# Patient Record
Sex: Female | Born: 1980 | Race: White | Hispanic: No | Marital: Single | Smoking: Former smoker
Health system: Southern US, Community
[De-identification: ages and names within clinical notes are randomized; demographics above are authoritative.]

## PROBLEM LIST (undated history)

## (undated) DIAGNOSIS — K589 Irritable bowel syndrome without diarrhea: Secondary | ICD-10-CM

## (undated) DIAGNOSIS — K6289 Other specified diseases of anus and rectum: Secondary | ICD-10-CM

## (undated) DIAGNOSIS — K649 Unspecified hemorrhoids: Secondary | ICD-10-CM

## (undated) DIAGNOSIS — E785 Hyperlipidemia, unspecified: Secondary | ICD-10-CM

## (undated) DIAGNOSIS — B029 Zoster without complications: Secondary | ICD-10-CM

## (undated) DIAGNOSIS — Z973 Presence of spectacles and contact lenses: Secondary | ICD-10-CM

## (undated) DIAGNOSIS — K602 Anal fissure, unspecified: Secondary | ICD-10-CM

## (undated) DIAGNOSIS — J45909 Unspecified asthma, uncomplicated: Secondary | ICD-10-CM

## (undated) DIAGNOSIS — J189 Pneumonia, unspecified organism: Secondary | ICD-10-CM

## (undated) HISTORY — DX: Irritable bowel syndrome without diarrhea: K58.9

## (undated) HISTORY — DX: Zoster without complications: B02.9

## (undated) HISTORY — DX: Pneumonia, unspecified organism: J18.9

## (undated) HISTORY — PX: COLONOSCOPY: SHX174

## (undated) HISTORY — DX: Anal fissure, unspecified: K60.2

## (undated) HISTORY — PX: WISDOM TOOTH EXTRACTION: SHX21

## (undated) HISTORY — DX: Unspecified asthma, uncomplicated: J45.909

## (undated) HISTORY — DX: Unspecified hemorrhoids: K64.9

## (undated) HISTORY — DX: Hyperlipidemia, unspecified: E78.5

---

## 2007-03-03 ENCOUNTER — Ambulatory Visit: Payer: Self-pay | Admitting: Family Medicine

## 2007-03-05 HISTORY — PX: COLONOSCOPY: SHX174

## 2007-05-25 ENCOUNTER — Ambulatory Visit: Payer: Self-pay | Admitting: Gastroenterology

## 2007-05-31 ENCOUNTER — Emergency Department: Payer: Self-pay | Admitting: Emergency Medicine

## 2008-02-09 ENCOUNTER — Ambulatory Visit: Payer: Self-pay | Admitting: Surgery

## 2008-02-12 ENCOUNTER — Ambulatory Visit: Payer: Self-pay | Admitting: Surgery

## 2011-01-18 ENCOUNTER — Encounter: Payer: Self-pay | Admitting: Internal Medicine

## 2011-01-18 ENCOUNTER — Ambulatory Visit (INDEPENDENT_AMBULATORY_CARE_PROVIDER_SITE_OTHER): Payer: PRIVATE HEALTH INSURANCE | Admitting: Internal Medicine

## 2011-01-18 VITALS — BP 104/60 | HR 77 | Temp 98.2°F | Resp 16 | Ht 65.5 in | Wt 132.2 lb

## 2011-01-18 DIAGNOSIS — K589 Irritable bowel syndrome without diarrhea: Secondary | ICD-10-CM

## 2011-01-18 DIAGNOSIS — E785 Hyperlipidemia, unspecified: Secondary | ICD-10-CM

## 2011-01-18 MED ORDER — DICYCLOMINE HCL 20 MG PO TABS: 20.0000 mg | ORAL_TABLET | Freq: Three times a day (TID) | ORAL | Status: DC | PRN

## 2011-01-18 NOTE — Patient Instructions (Addendum)
You should repeat a fasting lipid panel to recheck triglycerides.  No food  But ok to drink water,  8 hr food fast,   Please ask them to check a b12 /rbc folate and a cbc with your blood draw.    Try the dicylcomine 30 minutes prior to meals to see if it helps the bowel movements  If no results in a few weeks, call and we'll set up the ultrasound.

## 2011-01-18 NOTE — Progress Notes (Signed)
  Subjective:    Patient ID: Robin Macdonald, female    DOB: March 05, 1980, 30 y.o.   MRN: [id number]  HPI 30 yr old female of [place] descent who presents with a 3 yr history abdominal distension and bloating.  Symptoms are aggravated by heavy menses.  She also has a history of hemorrhoids despite a well balanced vegetarian diet that includes [place] yogurt and plenty of legu,es.  She had a prior colonoscopy that was notable for a few noncancerous polyps that were removed, hemorrhoids which  were banded with good resolution. She has no weight loss, acutally has had unexpected weight gain but has not been running for the past year and a half and has gained 15 lbs.  Moderate alcohol use.  Last pelvic/PAP was normal 2011 .  Had nonfasting labs done recently which were notable for a normal ALT, normal Cr , triglycerides of 328, random glucose 102.    History reviewed. No pertinent past medical history.  No current outpatient prescriptions on file prior to visit.    Review of Systems  Constitutional: Positive for unexpected weight change.  Gastrointestinal: Positive for abdominal distention.  Neurological: Positive for headaches.  Psychiatric/Behavioral: Positive for behavioral problems and dysphoric mood.       Eating disorder  All other systems reviewed and are negative.   BP 104/60  Pulse 77  Temp(Src) 98.2 F (36.8 C) (Oral)  Resp 16  Ht 5' 5.5" (1.664 m)  Wt 132 lb 4 oz (59.988 kg)  BMI 21.67 kg/m2  SpO2 100%  LMP 12/28/2010     Objective:   Physical Exam  Constitutional: She is oriented to person, place, and time. She appears well-developed and well-nourished.  HENT:  Mouth/Throat: Oropharynx is clear and moist.  Eyes: EOM are normal. Pupils are equal, round, and reactive to light. No scleral icterus.  Neck: Normal range of motion. Neck supple. No JVD present. No thyromegaly present.  Cardiovascular: Normal rate, regular rhythm, normal heart sounds and intact distal pulses.     Pulmonary/Chest: Effort normal and breath sounds normal.  Abdominal: Soft. Bowel sounds are normal. She exhibits no mass. There is no tenderness.  Musculoskeletal: Normal range of motion. She exhibits no edema.  Lymphadenopathy:    She has no cervical adenopathy.  Neurological: She is alert and oriented to person, place, and time.  Skin: Skin is warm and dry.  Psychiatric: She has a normal mood and affect.          Assessment & Plan:  Irritable bowel syndrome:  Suggested by 3 yr history of bloating and distension,  DDx would also include ovarian Ca, which we discussed ruling out if trial of dicyclomine does not improve symptoms.

## 2011-01-20 ENCOUNTER — Encounter: Payer: Self-pay | Admitting: Internal Medicine

## 2011-01-20 DIAGNOSIS — E785 Hyperlipidemia, unspecified: Secondary | ICD-10-CM | POA: Insufficient documentation

## 2011-01-20 NOTE — Assessment & Plan Note (Signed)
Recommended repeatlipid panel in a fasting state to rule out need for medication for hypertriglyceridemia

## 2011-08-12 ENCOUNTER — Encounter: Payer: Self-pay | Admitting: Internal Medicine

## 2013-07-02 DIAGNOSIS — B029 Zoster without complications: Secondary | ICD-10-CM

## 2013-07-02 HISTORY — DX: Zoster without complications: B02.9

## 2013-07-29 ENCOUNTER — Encounter: Payer: Self-pay | Admitting: Internal Medicine

## 2013-08-02 ENCOUNTER — Telehealth: Payer: Self-pay | Admitting: Internal Medicine

## 2013-08-02 NOTE — Telephone Encounter (Signed)
Patient is c/o severe rectal pain and hemorrhoids.  Her mother reports that she was evaluated at an urgent care for the pain, but they did not examine her.  They gave her Tramadol for the pain this is not helping at all.  Mother reports that she is screaming in pain, she will not eat or drink because she is scared she will have a BM.  I have moved her appt up to Thursday at 3:15 with Dr. Leone Payor.  Mom wants to know what can be done until the office visit.  She is encouraged to have her try sitzbaths and recticare.  If pain is unbearable she will need ER evaluation until office visit on Thursday.  Mom reports that patient does not have any insurance.  She is asked to bring $184 to the appt.

## 2013-08-05 ENCOUNTER — Ambulatory Visit (INDEPENDENT_AMBULATORY_CARE_PROVIDER_SITE_OTHER): Payer: PRIVATE HEALTH INSURANCE | Admitting: Internal Medicine

## 2013-08-05 ENCOUNTER — Encounter: Payer: Self-pay | Admitting: Internal Medicine

## 2013-08-05 VITALS — BP 90/60 | HR 120 | Ht 65.0 in | Wt 111.1 lb

## 2013-08-05 DIAGNOSIS — K610 Anal abscess: Secondary | ICD-10-CM

## 2013-08-05 DIAGNOSIS — K612 Anorectal abscess: Secondary | ICD-10-CM

## 2013-08-05 MED ORDER — OXYCODONE HCL 5 MG PO CAPS: 5.0000 mg | ORAL_CAPSULE | ORAL | Status: DC | PRN

## 2013-08-05 NOTE — Patient Instructions (Signed)
You have been scheduled for an appointment with Dr. Chevis Pretty at [facility]. Your appointment is on 08/06/13 at 3:45pm. Please arrive at 3:30pm for registration. Make certain to bring a list of current medications, including any over the counter medications or vitamins. Also bring your co-pay if you have one as well as your insurance cards. [facility] is located at [address], [address]. Should you need to reschedule your appointment, please contact them at [phone].  You have been given a rx for oxy-IR to take to your pharmacy.  You have been given information on perianal abscess and sitz baths.   I appreciate the opportunity to care for you.

## 2013-08-06 ENCOUNTER — Telehealth: Payer: Self-pay | Admitting: Internal Medicine

## 2013-08-06 ENCOUNTER — Ambulatory Visit (HOSPITAL_COMMUNITY)
Admission: RE | Admit: 2013-08-06 | Discharge: 2013-08-06 | Disposition: A | Discharge status: Home / Self Care | Payer: PRIVATE HEALTH INSURANCE | Source: Ambulatory Visit | Attending: General Surgery | Admitting: General Surgery

## 2013-08-06 ENCOUNTER — Encounter (INDEPENDENT_AMBULATORY_CARE_PROVIDER_SITE_OTHER): Payer: Self-pay | Admitting: General Surgery

## 2013-08-06 ENCOUNTER — Ambulatory Visit (INDEPENDENT_AMBULATORY_CARE_PROVIDER_SITE_OTHER): Payer: Self-pay | Admitting: General Surgery

## 2013-08-06 ENCOUNTER — Encounter (HOSPITAL_COMMUNITY): Payer: Self-pay

## 2013-08-06 ENCOUNTER — Encounter: Payer: Self-pay | Admitting: Internal Medicine

## 2013-08-06 VITALS — BP 126/74 | HR 94 | Temp 98.4°F | Ht 65.0 in | Wt 111.0 lb

## 2013-08-06 DIAGNOSIS — K828 Other specified diseases of gallbladder: Secondary | ICD-10-CM | POA: Insufficient documentation

## 2013-08-06 DIAGNOSIS — K6289 Other specified diseases of anus and rectum: Secondary | ICD-10-CM | POA: Insufficient documentation

## 2013-08-06 DIAGNOSIS — K610 Anal abscess: Secondary | ICD-10-CM | POA: Insufficient documentation

## 2013-08-06 DIAGNOSIS — R109 Unspecified abdominal pain: Secondary | ICD-10-CM | POA: Insufficient documentation

## 2013-08-06 MED ORDER — IOHEXOL 300 MG/ML  SOLN
80.0000 mL | Freq: Once | INTRAMUSCULAR | Status: AC | PRN
  Administered 2013-08-06: 80 mL via INTRAVENOUS

## 2013-08-06 MED ORDER — IOHEXOL 300 MG/ML  SOLN
50.0000 mL | Freq: Once | INTRAMUSCULAR | Status: AC | PRN
  Administered 2013-08-06: 50 mL via ORAL

## 2013-08-06 NOTE — Assessment & Plan Note (Signed)
Oxycodone prn Surgical referral No Abx - no cellulitis Sitz baths

## 2013-08-06 NOTE — Patient Instructions (Signed)
Will get CT of pelvis  

## 2013-08-06 NOTE — Telephone Encounter (Signed)
[name] calling to ask for more pain medication because she is going to run out before the end of the weekend. She received #15 Oxycodone on 08/05/2013 by Dr Leone Payor, and Tramadol 50 mg, no quantity given. I told her I cannot cal lin a narcotic to a Pharmacy, it has to be a written prescription., Her Oxycodone should last through the weekend, assuming improvement in rectal pain. I asked her to call back in 2 days if she truly runs out of pain med. Would take Tramadol instead.

## 2013-08-06 NOTE — Progress Notes (Signed)
Subjective:    Patient ID: Robin Macdonald, female    DOB: 05/04/1980, 33 y.o.   MRN: [id number]  HPI The patient is here because of intense rectal pain. It started in late May, she went to an urgent care, they did not examine her rectum but [place] [place] [facility] [place] [facility]. Her mother is a patient of mine. She requested to be seen because of persistent symptoms of severe rectal pain which is present all the time but much worse with and after defecation. She denies any fever or purulent discharge though she has had a history of hemorrhoids with some rectal bleeding. She has not been eating because she is afraid of defecation and pain that is associated with that. There is a history of anal fissure reported as well. She really had been doing okay without any defecation problems her pain until mid to late May. She has tried Recti-care, hydrocortisone cream seems over-the-counter creams and is using sitz baths with minimal if any relief.  Allergies  Allergen Reactions  . Milk-Related Compounds Other (See Comments)    Stomach Pains  . Sudafed [Pseudoephedrine]    Outpatient Prescriptions Prior to Visit  Medication Sig Dispense Refill  . Multiple Vitamin (MULTIVITAMIN) tablet Take 1 tablet by mouth daily.        Marland Kitchen. dicyclomine (BENTYL) 20 MG tablet Take 1 tablet (20 mg total) by mouth 3 (three) times daily as needed.  90 tablet  1   No facility-administered medications prior to visit.   Past Medical History  Diagnosis Date  . Hyperlipidemia   . IBS (irritable bowel syndrome)   . Anal fissure   . Hemorrhoids   . Asthma   . Pneumonia   . Shingles outbreak 07/2013    in her mouth   Past Surgical History  Procedure Laterality Date  . Colonoscopy      with hemorrhoidectomy  . Wisdom tooth extraction     History   Social History  . Marital Status: Single    Spouse Name: N/A    Number of Children: 0  . Years of Education: 16   Occupational  History  . 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 Smoking status: Former Smoker    Types: Cigarettes    Quit date: 03/04/2012  . Smokeless tobacco: Never Used  . Alcohol Use: 3.5 oz/week    7 drink(s) per week     Comment: one per week  . Drug Use: No  . Sexual Activity: Yes    Family History  Problem Relation Age of Onset  . Prostate cancer Maternal Grandfather   . Arthritis Paternal Grandfather   . Heart disease Paternal Grandfather   . Alcohol abuse Neg Hx   . Hyperlipidemia Neg Hx   . Cirrhosis Mother        Review of Systems Positive for menstrual cramps, night sweats fatigue. All other review of systems negative or as per history of present illness.    Objective:   Physical Exam WDWN NAD Abd soft NT Rectal   Patti Swazil[place]Jordan CMA present  Bulge at left lateral area (6 oclock l let decub), very tender firm w/ofluctuance, extends posterior  Otherwise ok  No cellulitis       Assessment & Plan:  Perianal abscess - left Oxycodone prn Surgical referral No Abx - no cellulitis Sitz baths  I appreciate the opportunity to care for this patient.  RU:EAVWU,[id number], MD Royston Sinner, MD

## 2013-08-07 ENCOUNTER — Telehealth (INDEPENDENT_AMBULATORY_CARE_PROVIDER_SITE_OTHER): Payer: Self-pay | Admitting: Surgery

## 2013-08-07 NOTE — Telephone Encounter (Signed)
Ms. Gheen called about continued rectal pain.  It's about 7:30 PM Saturday night.  She has had some rectal pain for some time, like months (or longer). It seems like it has gotten worse. She saw Dr. Leone Payor 6/4, Thurs, who thought she had a left perianal abscess, but did not put her on antibiotics. He referred her to Dr. Carolynne Edouard who saw her 6/5, Fri.  Unfortunately, there is no note from Dr. Carolynne Edouard in Epic at this time.  But he got a CT scan on 08/06/2013 which showed some mild rectal thickening, but no abscess.  I read her the report over the phone.  She has no insurance, which may have affected some of her decisions.  I told her she could go to an ER for an evaluation.  Or wait until Monday and talk both to our office (and Dr. Carolynne Edouard) and Dr. Marvell Fuller office to get a plan.  She may need an exam under anesthesia to see what is going on.  For now she can continue sitz baths, topical rectal ointments, and NSAIDs.  Ovidio Kin, MD, [facility] Pager: [phone] Office phone:  [phone]

## 2013-08-09 ENCOUNTER — Telehealth (INDEPENDENT_AMBULATORY_CARE_PROVIDER_SITE_OTHER): Payer: Self-pay

## 2013-08-09 ENCOUNTER — Other Ambulatory Visit (INDEPENDENT_AMBULATORY_CARE_PROVIDER_SITE_OTHER): Payer: Self-pay | Admitting: General Surgery

## 2013-08-09 ENCOUNTER — Telehealth: Payer: Self-pay | Admitting: Internal Medicine

## 2013-08-09 DIAGNOSIS — K6289 Other specified diseases of anus and rectum: Secondary | ICD-10-CM

## 2013-08-09 MED ORDER — OXYCODONE HCL 5 MG PO CAPS: 5.0000 mg | ORAL_CAPSULE | ORAL | Status: DC | PRN

## 2013-08-09 NOTE — Telephone Encounter (Signed)
Message copied by Brennan Bailey on Mon Aug 09, 2013  2:27 PM ------      Message from: Wilder Glade      Created: Mon Aug 09, 2013  9:22 AM      Regarding: FW: Call patient Monday                   ----- Message -----         From: Kandis Cocking, MD         Sent: 08/07/2013   7:23 PM           To: Robyne Askew, MD, [facility] Clinical Pool      Subject: Call patient Monday                                      Please call Ms. Cajuste Monday AM to check on her.  She called me Saturday night, but there is not a lot in Epic.            She saw Dr. Carolynne Edouard on Friday afternoon.  I read her the CT scan report (which shows no abscess), but she is still having significant rectal pain.            She will probably need some plan of where to go from here between Dr. Leone Payor and Dr. Carolynne Edouard.            I have copied this note to Dr. Carolynne Edouard.            Thanks,      Onalee Hua ------

## 2013-08-09 NOTE — Telephone Encounter (Signed)
Patient will come pick up RX.  Dr. Purvis Sheffield office can't see her until 08/23/13.  She is encouraged to contact [facility] and see if there is an another provider she can see prior to 6/22

## 2013-08-09 NOTE — Telephone Encounter (Signed)
Given her history of a "stapled hemorrhoidectomy" and her unusual pain I told her I thought she should see Dr. Maisie Fus. Thanks. Carolynne Edouard

## 2013-08-09 NOTE — Progress Notes (Signed)
Patient ID: Robin Macdonald, female   DOB: 11/21/1980, 33 y.o.   MRN: [id number]  Chief Complaint  Patient presents with  . peri anal abscess    HPI Robin Macdonald is a 33 y.o. female.  We are asked to see the patient in consultation by Dr. Darrick Huntsmanullo to evaluate her for rectal pain. The patient is a 33 year old white female who has been having pretty severe rectal pain for the last week or 2. The pain occurs pretty much all the time but is worse with bowel movements. Because of the pain she has quit eating. She denies any blood with her stools. She denies any fevers or chills. She went to the emergency department and was told that she had a perirectal abscess. She was then sent here for further evaluation. She does note that a couple years ago she had what she calls a stapled hemorrhoidectomy. She has had problems with her rectum apparently ever since then.  HPI  Past Medical History  Diagnosis Date  . Hyperlipidemia   . IBS (irritable bowel syndrome)   . Anal fissure   . Hemorrhoids   . Asthma   . Pneumonia   . Shingles outbreak 07/2013    in her mouth    Past Surgical History  Procedure Laterality Date  . Colonoscopy      with hemorrhoidectomy  . Wisdom tooth extraction      Family History  Problem Relation Age of Onset  . Prostate cancer Maternal Grandfather   . Arthritis Paternal Grandfather   . Heart disease Paternal Grandfather   . Alcohol abuse Neg Hx   . Hyperlipidemia Neg Hx   . Cirrhosis Mother     Social History History  Substance Use Topics  . Smoking status: Former Smoker    Types: Cigarettes    Quit date: 03/04/2012  . Smokeless tobacco: Never Used  . Alcohol Use: 3.5 oz/week    7 drink(s) per week     Comment: one per week    Allergies  Allergen Reactions  . Milk-Related Compounds Other (See Comments)    Stomach Pains  . Sudafed [Pseudoephedrine]     Current Outpatient Prescriptions  Medication Sig Dispense Refill  . B Complex-C  (B-COMPLEX WITH VITAMIN C) tablet Take 1 tablet by mouth daily.      Marland Kitchen. docusate sodium (COLACE) 100 MG capsule Take 100 mg by mouth as needed for mild constipation.      Marland Kitchen. ibuprofen (ADVIL,MOTRIN) 400 MG tablet Take 400 mg by mouth as needed.      . Lidocaine, Anorectal, (RECTICARE) 5 % CREA Apply topically as needed.      . Multiple Vitamin (MULTIVITAMIN) tablet Take 1 tablet by mouth daily.        Marland Kitchen. oxycodone (OXY-IR) 5 MG capsule Take 1 capsule (5 mg total) by mouth every 4 (four) hours as needed.  15 capsule  0  . traMADol (ULTRAM) 50 MG tablet Take 50 mg by mouth as needed.       No current facility-administered medications for this visit.    Review of Systems Review of Systems  Constitutional: Negative.   HENT: Negative.   Eyes: Negative.   Respiratory: Negative.   Cardiovascular: Negative.   Gastrointestinal: Positive for rectal pain.  Endocrine: Negative.   Genitourinary: Negative.   Musculoskeletal: Negative.   Skin: Negative.   Allergic/Immunologic: Negative.   Neurological: Negative.   Hematological: Negative.   Psychiatric/Behavioral: Negative.     Blood pressure 126/74, pulse 94,  temperature 98.4 F (36.9 C), height 5\' 5"  (1.651 m), weight 111 lb (50.349 kg), last menstrual period 08/03/2013.  Physical Exam Physical Exam  Constitutional: She is oriented to person, place, and time. She appears well-developed and well-nourished.  HENT:  Head: Normocephalic and atraumatic.  Eyes: Conjunctivae and EOM are normal. Pupils are equal, round, and reactive to light.  Neck: Normal range of motion. Neck supple.  Cardiovascular: Normal rate, regular rhythm and normal heart sounds.   Pulmonary/Chest: Effort normal and breath sounds normal.  Abdominal: Soft. Bowel sounds are normal.  Genitourinary:  Her perirectal skin looks normal. There is no evidence of cellulitis or fluctuance. Posteriorly along the midline she seems to have a palpable cord which seems to also be the most  tender place for her. She would not allow a digital exam or anoscopic exam.  Musculoskeletal: Normal range of motion.  Lymphadenopathy:    She has no cervical adenopathy.  Neurological: She is alert and oriented to person, place, and time.  Skin: Skin is warm and dry.  Psychiatric: She has a normal mood and affect. Her behavior is normal.    Data Reviewed As above  Assessment    The patient has significant rectal pain the source of which is unclear. She does not have anything obvious externally that would tell us that she has a perirectal abscess. She does have an abnormal palpable cord posteriorly but no evidence of fistula externally.     Plan    At this point I would recommend getting a CT scan of her pelvis to make sure she doesn't have a deeper abscess. If she does not then I think she may need to see our colorectal specialist for an exam under anesthesia        Caleen Essex III 08/09/2013, 10:40 AM

## 2013-08-09 NOTE — Telephone Encounter (Signed)
Per epic note, pt seeing another Development worker, international aid. Pt not happy with the care she received here.

## 2013-08-09 NOTE — Telephone Encounter (Signed)
Okay to refill oxycodone #30 See if she can get in to see the [facility] surgeons, Dr. Lemar Livings We had canceled her June 10 appointment because she was asking for more urgent care and I think needed it but since she is unhappy with [facility] at this point it sounds like moving her to Dr. Lemar Livings make sense.

## 2013-08-09 NOTE — Telephone Encounter (Signed)
Please check on her today - [facility] should be follwing up with her

## 2013-08-09 NOTE — Telephone Encounter (Signed)
Patient is requesting a refill of her oxycodone.  She was given #15 on 6/4.  She states that tramadol does not help at all. She is not happy with [facility].  She spoke with them this am and apparently they are going to arrange for her to see a Careers adviser in [place].  She does not want to return to [facility].  She states she is still in a great deal of pain unable to sit or eat.  Dr. Carolynne Edouard has not completed his note on F[place], but there is a CT scan in the system.  Dr. Leone Payor please advise.

## 2013-08-09 NOTE — Telephone Encounter (Signed)
See phone note from today for additional details 

## 2013-08-09 NOTE — Telephone Encounter (Signed)
Pt was seen by Dr.Toth on 08/06/13 for rectal pain.  She has had a CT scan.  Pt originally wanted a referral to [facility], but has changed her mind and would like to see Dr. Carolynne Edouard again.  I told the patient that her next step appeared to be an examination under anesthesia at the hospital or day surgery center.  Message to be routed to Dr. Carolynne Edouard.  He will let us know if patient will need to come back to the office or he would go ahead and schedule the procedure.  Pt agreed.

## 2013-08-11 ENCOUNTER — Ambulatory Visit: Payer: Self-pay | Admitting: General Surgery

## 2013-08-11 ENCOUNTER — Ambulatory Visit (INDEPENDENT_AMBULATORY_CARE_PROVIDER_SITE_OTHER): Payer: Self-pay | Admitting: General Surgery

## 2013-08-11 ENCOUNTER — Encounter (INDEPENDENT_AMBULATORY_CARE_PROVIDER_SITE_OTHER): Payer: Self-pay | Admitting: General Surgery

## 2013-08-11 VITALS — BP 100/80 | HR 61 | Resp 12 | Ht 65.0 in | Wt 110.2 lb

## 2013-08-11 DIAGNOSIS — K6289 Other specified diseases of anus and rectum: Secondary | ICD-10-CM

## 2013-08-11 NOTE — Patient Instructions (Signed)
Use diltiazem ointment 4 times a day.  Use sitz baths and Recticare to control pain.  Call the office in 2 weeks if your symptoms aren't getting better.    WHAT IS AN ANAL FISSURE? An anal fissure (fissure-in-ano) is a small, oval shaped tear in skin that lines the opening of the anus. Fissures typically cause severe pain and bleeding with bowel movements. Fissures are quite common in the general population, but are often confused with other causes of pain and bleeding, such as hemorrhoids. WHAT ARE THE SYMPTOMS OF AN ANAL FISSURE? The typical symptoms of an anal fissure include severe pain during, and especially after, a bowel movement, lasting from several minutes to a few hours. Patients may also notice bright red blood from the anus that can be seen on the toilet paper or on the stool. Between bowel movements, patients with anal fissures are often relatively symptom-free. Many patients are fearful of having a bowel movement and may try to avoid defecation secondary to the pain.  WHAT CAUSES AN ANAL FISSURE? Fissures are usually caused by trauma to the inner lining of the anus. Patients with tight anal sphincter muscles (i.e., increased muscle tone) are more prone to developing anal fissures. A hard, dry bowel movement is typically responsible, but loose stools and diarrhea can also be the cause. Following a bowel movement, severe anal pain can produce spasm of the anal sphincter muscle, resulting in a decrease in blood flow to the site of the injury, thus impairing healing of the wound. The next bowel movement results in more pain, anal spasm, decreased blood flow to the area, and the cycle continues. Treatments are aimed at interrupting this cycle by relaxing the anal sphincter muscle to promote healing of the fissure.  Other, less common, causes include inflammatory conditions and certain anal infections or tumors. Anal fissures may be acute (recent onset) or chronic (present for a long period of  time). Chronic fissures may be more difficult to treat, and may also have an external lump associated with the tear, called a sentinel pile or skin tag, as well as extra tissue just inside the anal canal (hypertrophied papilla) . WHAT IS THE TREATMENT OF ANAL FISSURES? The majority of anal fissures do not require surgery. The most common treatment for an acute anal fissure consists of making the stool more formed and bulky with a diet high in fiber and utilization of over-the-counter fiber supplementation (totaling 25-35 grams of fiber/day). Stool softeners and increasing water intake may be necessary to promote soft bowel movements and aid in the healing process. Topical anesthetics for pain and warm tub baths (sitz baths) for 10-20 minutes several times a day (especially after bowel movements) are soothing and promote relaxation of the anal muscles, which may help the healing process.  Other medications (such as nitroglycerin, nifedipine, or diltiazem) may be prescribed that allow relaxation of the anal sphincter muscles. Your surgeon will go over benefits and side-effects of each of these with you. Narcotic pain medications are not recommended for anal fissures, as they promote constipation. Chronic fissures are generally more difficult to treat, and your surgeon may advise surgical treatment. WILL THE PROBLEM RETURN? Fissures can recur easily, and it is quite common for a fully healed fissure to recur after a hard bowel movement or other trauma. Even when the pain and bleeding have subsided, it is very important to continue good bowel habits and a diet high in fiber as a lifestyle change. If the problem returns without an  obvious cause, further assessment is warranted. WHAT CAN BE DONE IF THE FISSURE DOES NOT HEAL? A fissure that fails to respond to conservative measures should be re-examined. Persistent hard or loose bowel movements, scarring, or spasm of the internal anal muscle all contribute to  delayed healing. Other medical problems such as inflammatory bowel disease (Crohn's disease), infections, or anal tumors can cause symptoms similar to anal fissures. Patients suffering from persistent anal pain should be examined to exclude these symptoms. This may include a colonoscopy or an exam in the operating room under anesthesia. WHAT DOES SURGERY INVOLVE? Surgical options for treating anal fissure include Botulinum toxin (Botox) injection into the anal sphincter and surgical division of a portion of the internal anal sphincter (lateral internal sphincterotomy). Both of these are performed typically as outpatient, same-day procedures, or occasionally in the office setting. The goal of these surgical options is to promote relaxation of the anal sphincter, thereby decreasing anal pain and spasm, allowing the fissure to heal. Botox injection results in healing in 50-80% of patients, while sphincterotomy is reported to be over 90% successful. If a sentinel pile is present, it may be removed to promote healing of the fissure. All surgical procedures carry some risk, and a sphincterotomy can rarely interfere with one's ability to control gas and stool. Your colon and rectal surgeon will discuss these risks with you to determine the appropriate treatment for your particular situation. HOW LONG IS THE RECOVERY AFTER SURGERY? It is important to note that complete healing with both medical and surgical treatments can take up to approximately 6-10 weeks. However, acute pain after surgery often disappears after a few days. Most patients will be able to return to work and resume daily activities in a few short days after the surgery. CAN FISSURES LEAD TO COLON CANCER? Absolutely not. Persistent symptoms, however, need careful evaluation since other conditions other than an anal fissure can cause similar symptoms. Your colon and rectal surgeon may request additional tests, even if your fissure has successfully  healed. A colonoscopy may be required to exclude other causes of rectal bleeding. WHAT IS A COLON AND RECTAL SURGEON? Colon and rectal surgeons are experts in the surgical and non-surgical treatment of diseases of the colon, rectum and anus. They have completed advanced surgical training in the treatment of these diseases as well as full general surgical training. Board-certified colon and rectal surgeons complete residencies in general surgery and colon and rectal surgery, and pass intensive examinations conducted by the American Board of Surgery and the American Board of Colon and Rectal Surgery. They are well-versed in the treatment of both benign and malignant diseases of the colon, rectum and anus and are able to perform routine screening examinations and surgically treat conditions if indicated to do so.  Author: Tyrone AppleMichael A. Pennie BanterValente, DO, on behalf of the [employer]   2012 American Society of Colon & Rectal Surgeons

## 2013-08-11 NOTE — Progress Notes (Signed)
Chief Complaint  Patient presents with  . Rectal Pain    HISTORY: Robin AhoKRISTEN Nichole Macdonald is a 33 y.o. female who presents to the office with anal pain.  She has had severe anal pain for the past few weeks.  She was seen by Dr Leone PayorGessner who thought she may have had an abscess.  She was seen by Dr Carolynne Edouardoth, who did not feel she had an abscess and ordered a CT, which showed some posterior recal wall thickening.  Her pain is worse after BM's.  Other symptoms include some bleeding.  She has been taking stool softeners and has a high fiber diet and drinks plenty of water.  It is continuous in nature.  her bowel habits are regular and her bowel movements are usually soft to liquid stools.  her fiber intake is dietary.   She is s/p stapled hemorrhoidectomy at [facility] in [facility] (Dr Vilinda BlanksSkulsky).  This pain is not like her previous hemorrhoid pain.    Past Medical History  Diagnosis Date  . Hyperlipidemia   . IBS (irritable bowel syndrome)   . Anal fissure   . Hemorrhoids   . Asthma   . Pneumonia   . Shingles outbreak 07/2013    in her mouth      Past Surgical History  Procedure Laterality Date  . Colonoscopy      with hemorrhoidectomy  . Wisdom tooth extraction          Current Outpatient Prescriptions  Medication Sig Dispense Refill  . B Complex-C (B-COMPLEX WITH VITAMIN C) tablet Take 1 tablet by mouth daily.      Marland Kitchen. docusate sodium (COLACE) 100 MG capsule Take 100 mg by mouth as needed for mild constipation.      Marland Kitchen. ibuprofen (ADVIL,MOTRIN) 400 MG tablet Take 400 mg by mouth as needed.      . Lidocaine, Anorectal, (RECTICARE) 5 % CREA Apply topically as needed.      . Multiple Vitamin (MULTIVITAMIN) tablet Take 1 tablet by mouth daily.        Marland Kitchen. oxycodone (OXY-IR) 5 MG capsule Take 1 capsule (5 mg total) by mouth every 4 (four) hours as needed.  30 capsule  0   No current facility-administered medications for this visit.      Allergies  Allergen Reactions  . Milk-Related Compounds Other  (See Comments)    Stomach Pains  . Sudafed [Pseudoephedrine]       Family History  Problem Relation Age of Onset  . Prostate cancer Maternal Grandfather   . Arthritis Paternal Grandfather   . Heart disease Paternal Grandfather   . Alcohol abuse Neg Hx   . Hyperlipidemia Neg Hx   . Cirrhosis Mother     History   Social History  . Marital Status: Single    Spouse Name: N/A    Number of Children: 0  . Years of Education: 16   Occupational History  . 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 Smoking status: Former Smoker    Types: Cigarettes    Quit date: 03/04/2012  . Smokeless tobacco: Never Used  . Alcohol Use: 3.5 oz/week    7 drink(s) per week     Comment: one per week  . Drug Use: No  . Sexual Activity: Yes   Other Topics Concern  . 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      REVIEW OF SYSTEMS - PERTINENT POSITIVES ONLY: Review of Systems - General ROS: negative for - chills, fever or weight loss Hematological and Lymphatic ROS: negative for - bleeding problems, blood clots or bruising Respiratory ROS: no cough, shortness of breath, or wheezing Cardiovascular ROS: no chest pain or dyspnea on exertion Gastrointestinal ROS: no abdominal pain, change in bowel habits, or black or bloody stools Genito-Urinary ROS: no dysuria, trouble voiding, or hematuria  EXAM: Filed Vitals:   08/11/13 1044  BP: 100/80  Pulse: 61  Resp: 12    General appearance: alert and cooperative Resp: clear to auscultation bilaterally Cardio: regular rate and rhythm GI: normal findings: soft, non-tender  Anal exam: significant sphincter htn, pain to palpation posteriorly, unable to do complete exam due to pain.  No fluctuance or perianal masses.    ASSESSMENT AND  PLAN: Robin Macdonald is a 33 y.o. female with rectal pain.  I think her exam and CT scan point to this being a fissure.  It also could be a thrombosed internal hemorrhoid.  I have recommended that she continue her high fiber diet but watch out for diarrhea.  She will continue sitz baths and recticare for pain.  I will have her start diltiazem ointment.  She will call the office if her pain is not beginning to improve over the next 2 weeks.  If her pain remains we will plan on performing an EUA to evaluate for fissure, hemorrhoid disease, occult fistula or intersphincteric abscess.      Vanita Panda, MD Colon and Rectal Surgery / General Surgery [facility]      Visit Diagnoses: No diagnosis found.  Primary Care Physician: Duncan Dull, MD

## 2013-08-23 ENCOUNTER — Ambulatory Visit: Payer: Self-pay | Admitting: General Surgery

## 2013-09-22 ENCOUNTER — Encounter (INDEPENDENT_AMBULATORY_CARE_PROVIDER_SITE_OTHER): Payer: Self-pay | Admitting: General Surgery

## 2013-09-22 ENCOUNTER — Ambulatory Visit (INDEPENDENT_AMBULATORY_CARE_PROVIDER_SITE_OTHER): Payer: Self-pay | Admitting: General Surgery

## 2013-09-22 VITALS — BP 98/78 | HR 64 | Temp 98.6°F | Resp 14 | Ht 66.0 in | Wt 109.6 lb

## 2013-09-22 DIAGNOSIS — K6289 Other specified diseases of anus and rectum: Secondary | ICD-10-CM

## 2013-09-22 NOTE — Patient Instructions (Signed)
Continue diltiazem ointment.  Try adding fiber into your diet to help bulk up your stools.    GETTING TO GOOD BOWEL HEALTH. Irregular bowel habits such as diarrhea and multiple loose stools throughout the day can lead to many problems over time.  Having one soft, formed bowel movement a day is the most important way to prevent further problems.  The anorectal canal is designed to handle stretching and feces to safely manage our ability to get rid of solid waste (feces, poop, stool) out of our body.  BUT, diarrhea can be a burning fire to this very sensitive area of our body, causing inflamed hemorrhoids, anal fissures, increasing risk is perirectal abscesses, abdominal pain and bloating.      The goal: ONE SOFT BOWEL MOVEMENT A DAY!  To have soft, regular bowel movements:    Drink at least 8 tall glasses of water a day.     Take plenty of fiber.  Fiber is marketed as a cure for constipation, but in certain forms, it can help with loose stools as well.  Fiber is the undigested part of plant food that passes into the colon, acting as "natures broom" to encourage bowel motility and movement.  Fiber can absorb and hold large amounts of water. This results in a larger, bulkier stool, which is soft and easier to pass.    Work gradually over several weeks up to 6 servings a day of fiber (25g a day even more if needed) in the form of: o Vegetables -- Root (potatoes, carrots, turnips), leafy green (lettuce, salad greens, celery, spinach), or cooked high residue (cabbage, broccoli, etc) o Fruit -- Fresh (unpeeled skin & pulp), Dried (prunes, apricots, cherries, etc ),  or stewed ( applesauce)  o Whole grain breads, pasta, etc (whole wheat)  o Bran cereals    Bulking Agents -- This type of water-retaining fiber generally is easily tolerated and can help be a consistent source of fiber for irregular bowel habits.  Fibers that are recommended for this are:  o Methylcellulose --This is a fiber derived from wood  which also retains water. It is available as Citrucel.  o FiberCon (Polycarbiphil) is another good source of fiber for this that can help bulk your stools.

## 2013-09-22 NOTE — Progress Notes (Signed)
Robin Macdonald is a 33 y.o. female who is here for a follow up visit regarding her anal pain.  She states the diltiazem ointment seems to be working.  She is on a bland diet as well and her BM's are soft except for when she gets "stressed" and has more diarrhea.    Objective: Filed Vitals:   09/22/13 1031  BP: 98/78  Pulse: 64  Temp: 98.6 F (37 C)  Resp: 14    General appearance: alert and cooperative GI: normal findings: soft anal: sphincter htn present, less pain, did not perform DRE   Assessment and Plan: Cont current treatment.  RTO in 1 month.  Start adding fiber into diet to help bulk stools.     Vanita PandaAlicia C Josue Kass, MD [facility] [place] [phone]

## 2013-10-04 ENCOUNTER — Ambulatory Visit: Payer: PRIVATE HEALTH INSURANCE | Admitting: Internal Medicine

## 2013-10-29 ENCOUNTER — Ambulatory Visit (INDEPENDENT_AMBULATORY_CARE_PROVIDER_SITE_OTHER): Payer: Self-pay | Admitting: General Surgery

## 2013-10-29 ENCOUNTER — Telehealth (INDEPENDENT_AMBULATORY_CARE_PROVIDER_SITE_OTHER): Payer: Self-pay

## 2013-10-29 ENCOUNTER — Encounter (INDEPENDENT_AMBULATORY_CARE_PROVIDER_SITE_OTHER): Payer: Self-pay | Admitting: General Surgery

## 2013-10-29 VITALS — BP 110/62 | HR 74 | Temp 98.0°F | Resp 16 | Ht 66.0 in | Wt 111.8 lb

## 2013-10-29 DIAGNOSIS — K6289 Other specified diseases of anus and rectum: Secondary | ICD-10-CM

## 2013-10-29 MED ORDER — HYDROCORTISONE ACETATE 25 MG RE SUPP: 25.0000 mg | Freq: Two times a day (BID) | RECTAL | Status: AC

## 2013-10-29 NOTE — Telephone Encounter (Signed)
Pt calling in b/c she just left [facility] where they were quoting her a price on the Anusol suppositories $160 for 12 day supply. The pt has no insurance so she was wanting to see if there was something a little cheaper that could be prescribed for her anal pain. Pt has no insurance. The pt states that she is still having a lot of pain after the rectal exam this am. Please call pt to let her know if you prescribe something different.

## 2013-10-29 NOTE — Progress Notes (Signed)
Robin Macdonald is a 33 y.o. female who is here for a follow up visit regarding her anal pain. She has finisihed the diltiazem ointment and it seems to be working. She is still having pain after BM's.  Her stools are soft and regular. Objective:  BP 110/62  Pulse 74  Temp(Src) 98 F (36.7 C) (Oral)  Resp 16  Ht  (1.676 m)  Wt 111 lb 12.8 oz (50.712 kg)  BMI 18.05 kg/m2  General appearance: alert and cooperative  GI: normal findings: soft  anal: less sphincter htn present, less pain, pain posteriorly on DRE, anoscopy of the area shows slight inflammation posteriorly.    Assessment and Plan:  Anusol suppositories to help with inflammation. RTO in 1 month. Cont fiber to help bulk stools.

## 2013-10-29 NOTE — Patient Instructions (Signed)
Try using the suppositories 1-2 times a day for 2 weeks.  Ok to use as needed after that.

## 2013-10-29 NOTE — Telephone Encounter (Signed)
LMOM asking pt to return our call.  This is so that we can tell her, per Dr. Maisie Fus, she can order the anusolplus off of [facility] and it works the same.  It may take a couple of days for it to ship, but it is only around $17 rather than the $160.

## 2013-11-12 ENCOUNTER — Other Ambulatory Visit (INDEPENDENT_AMBULATORY_CARE_PROVIDER_SITE_OTHER): Payer: Self-pay | Admitting: General Surgery

## 2013-11-29 ENCOUNTER — Encounter (INDEPENDENT_AMBULATORY_CARE_PROVIDER_SITE_OTHER): Payer: Self-pay | Admitting: General Surgery

## 2013-12-03 ENCOUNTER — Encounter (HOSPITAL_BASED_OUTPATIENT_CLINIC_OR_DEPARTMENT_OTHER): Payer: Self-pay | Admitting: *Deleted

## 2013-12-03 NOTE — Progress Notes (Signed)
NPO AFTER MN.  ARRIVE AT 0900. NEEDS HG AND URINE PREG. 

## 2013-12-07 ENCOUNTER — Telehealth (INDEPENDENT_AMBULATORY_CARE_PROVIDER_SITE_OTHER): Payer: Self-pay

## 2013-12-07 NOTE — Telephone Encounter (Signed)
Pt is scheduled for sx tomorrow with Dr Maisie Fushomas. Pt states that she wants to cancel this sx. She states that she is feeling better and that she also has a wedding that she is going to and she may not be recovered by that time. Informed pt that I would send Dr Maisie Fushomas know. Informed Debbie that she also wants to cancel sx. Pt states that she will call when she is ready to reschedule.

## 2013-12-08 ENCOUNTER — Ambulatory Visit (HOSPITAL_BASED_OUTPATIENT_CLINIC_OR_DEPARTMENT_OTHER)
Admission: RE | Admit: 2013-12-08 | Payer: PRIVATE HEALTH INSURANCE | Source: Ambulatory Visit | Admitting: General Surgery

## 2013-12-08 ENCOUNTER — Encounter (INDEPENDENT_AMBULATORY_CARE_PROVIDER_SITE_OTHER): Payer: Self-pay | Admitting: General Surgery

## 2013-12-08 HISTORY — DX: Other specified diseases of anus and rectum: K62.89

## 2013-12-08 HISTORY — DX: Presence of spectacles and contact lenses: Z97.3

## 2013-12-08 SURGERY — SPHINCTEROTOMY, ANAL
Anesthesia: Choice | Site: Rectum

## 2015-06-02 IMAGING — CT CT ABD-PELV W/ CM
1 of 2 series · 15 of 32 positions shown, 19 images · IV contrast (omnipaque)
Comparison: None.

CLINICAL DATA: Lower abdominal and rectal region pain

EXAM:
CT ABDOMEN AND PELVIS WITH CONTRAST
TECHNIQUE: Multidetector CT imaging of the abdomen and pelvis was performed
using the standard protocol following bolus administration of
intravenous contrast. Oral contrast was also administered.
CONTRAST:  80mL OMNIPAQUE IOHEXOL 300 MG/ML  SOLN

[Series 2: abd/pel with · axial · 0.74mm/px · z∈[+1115,+1520]mm · 15 of 89 slices shown, 19 images]
[im 4/89  soft-tissue]
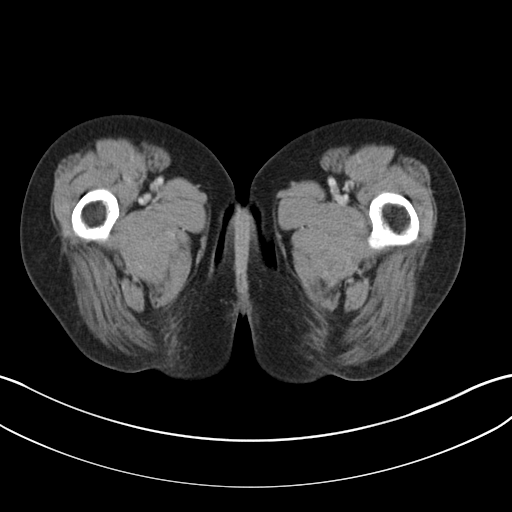
[im 4/89  bone]
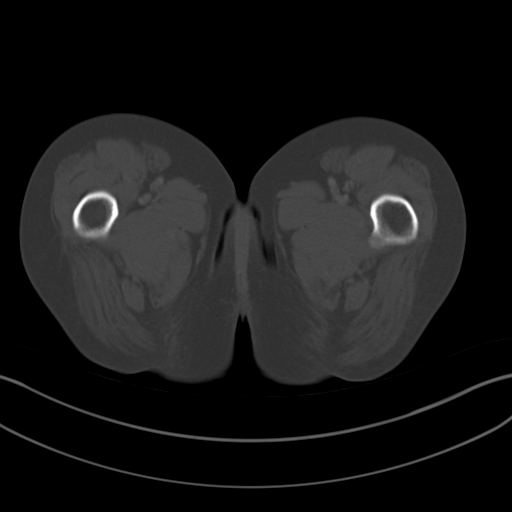
[im 11/89  soft-tissue]
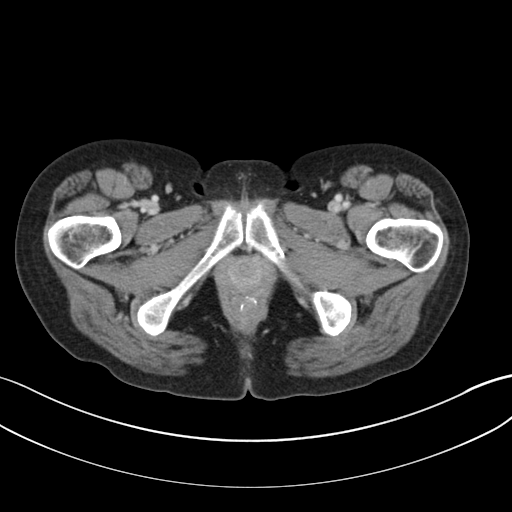
[im 18/89  soft-tissue]
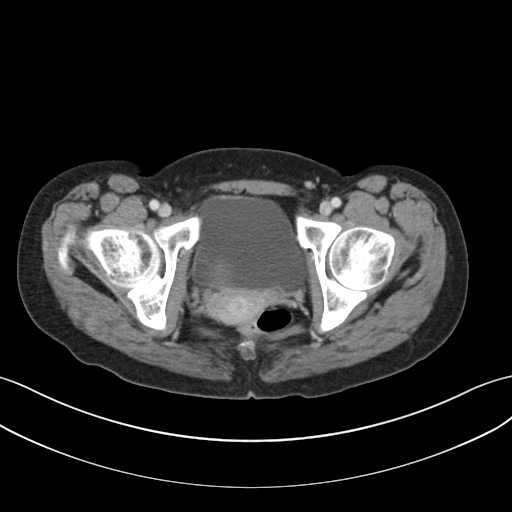
[im 25/89  soft-tissue]
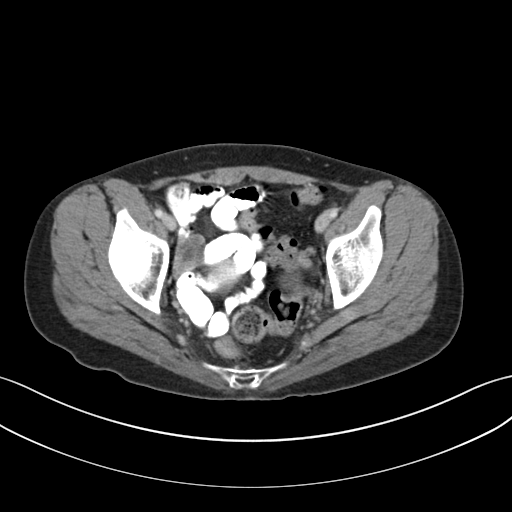
[im 32/89  soft-tissue]
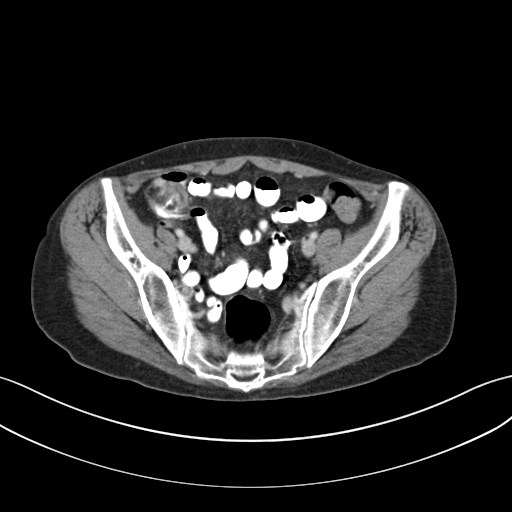
[im 39/89  soft-tissue]
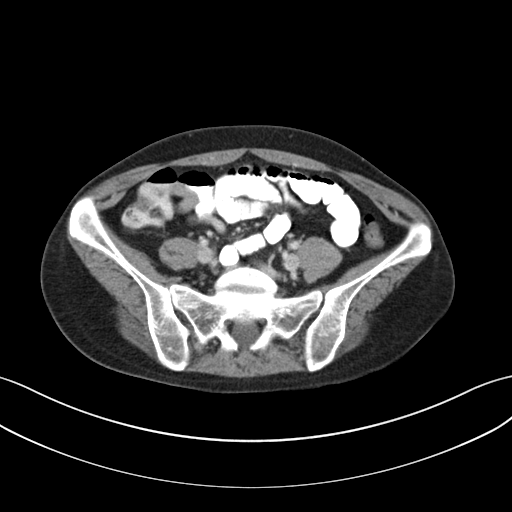
[im 46/89  soft-tissue]
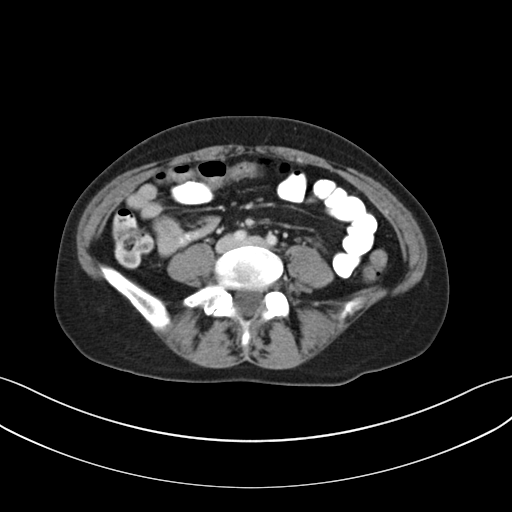
[im 50/89  soft-tissue]
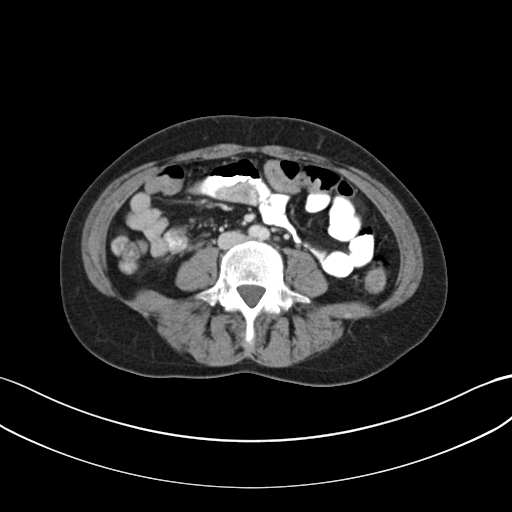
[im 57/89  soft-tissue]
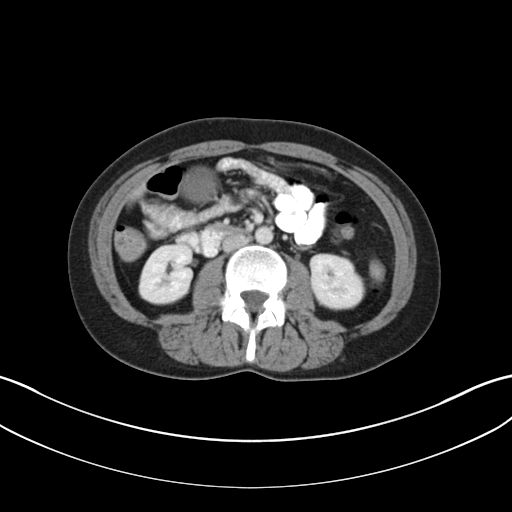
[im 57/89  bone]
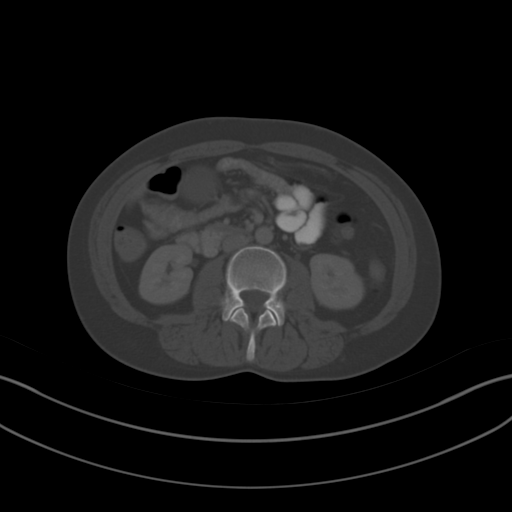
[im 64/89  soft-tissue]
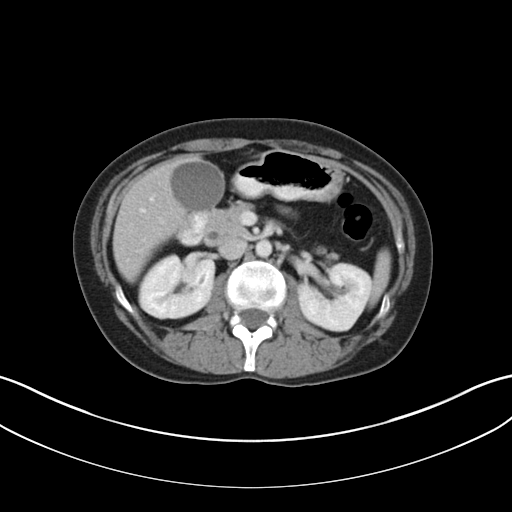
[im 71/89  soft-tissue]
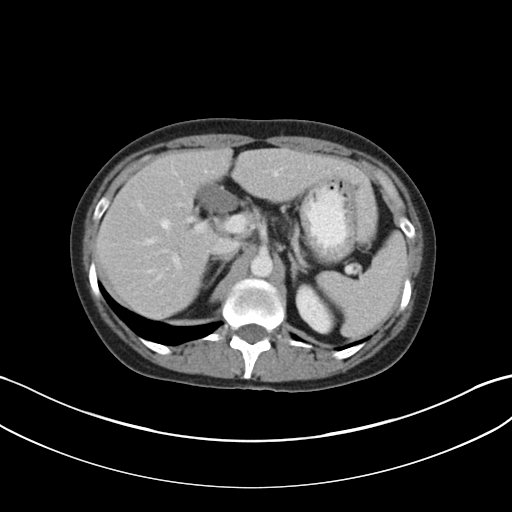
[im 74/89  lung]
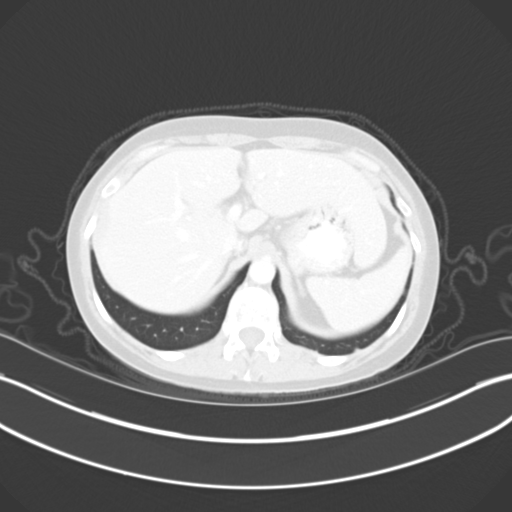
[im 78/89  soft-tissue]
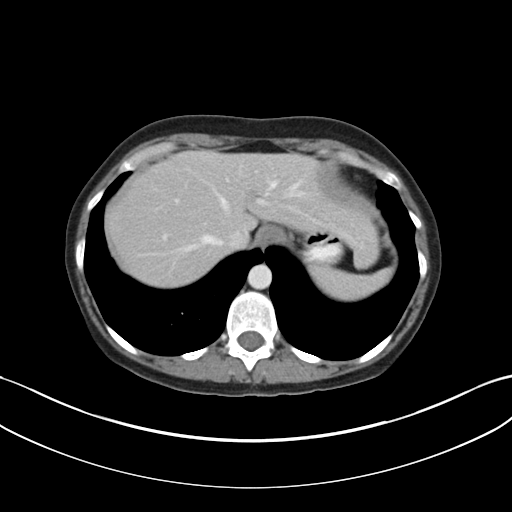
[im 78/89  lung]
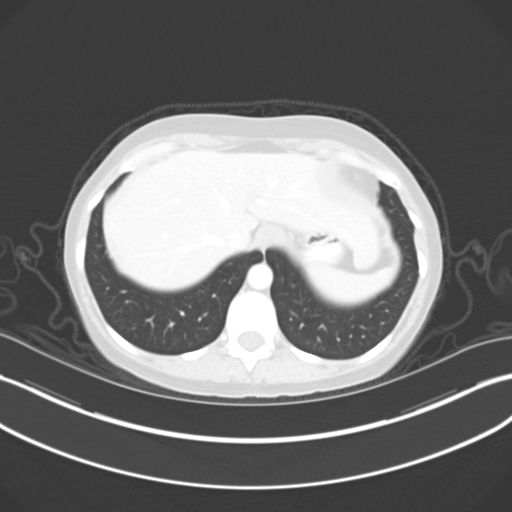
[im 81/89  lung]
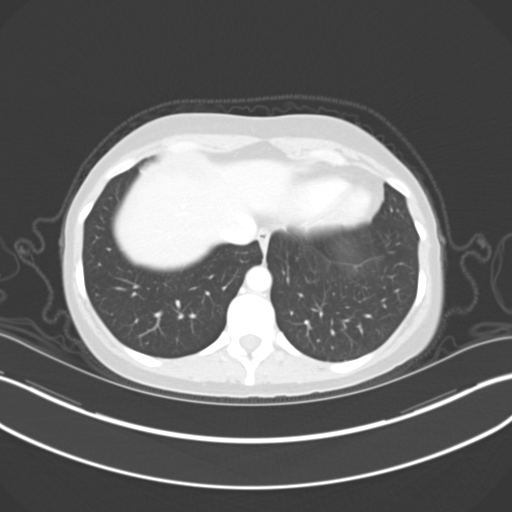
[im 85/89  soft-tissue]
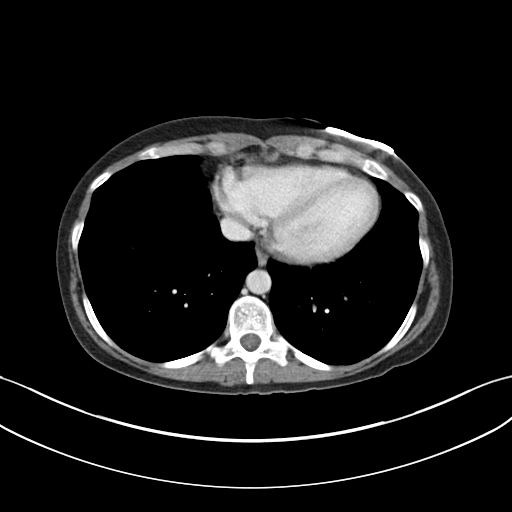
[im 85/89  lung]
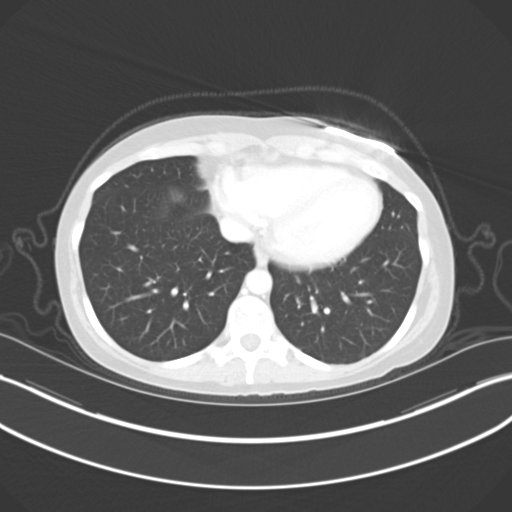

[15 of 32 positions shown; findings below may reference images not displayed]

FINDINGS: Lung bases are clear.

No focal liver lesions are identified. There is no appreciable
biliary duct dilatation. Gallbladder appears distended without wall
thickening or pericholecystic fluid. No gallstones are seen on this
study.

Spleen, pancreas, and adrenals appear within normal limits. Kidneys
bilaterally show no appreciable mass, calculus, or hydronephrosis on
either side. There is no ureteral calculus or ureterectasis on
either side.

In the pelvis, the urinary bladder is midline with normal wall
thickness. No pelvic mass is seen. There is some thickening in the
perirectal region without evidence of fistula. There is increased
attenuation material near the anorectal junction of uncertain
etiology. There is pelvic fluid collection. The appendix appears
normal.

There is no bowel obstruction. No free air or portal venous air.
There is no ascites, adenopathy, or abscess in the abdomen or
pelvis. There is no evidence of abdominal aortic aneurysm. There are
no blastic or lytic bone lesions.
IMPRESSION: There is mild thickening of the rectal wall. No fistula seen. There
is increased attenuation material near the anorectal junction of
uncertain etiology. Direct visualization of this area may be
warranted. The there is no well-defined pelvic mass or fluid
collection.

Appendix appears normal.  No bowel obstruction.  No abscess.

The gallbladder appears somewhat distended but otherwise
unremarkable.
